# Patient Record
Sex: Male | Born: 1970 | Hispanic: No | Smoking: Never smoker
Health system: Southern US, Community
[De-identification: ages and names within clinical notes are randomized; demographics above are authoritative.]

## PROBLEM LIST (undated history)

## (undated) DIAGNOSIS — J45909 Unspecified asthma, uncomplicated: Secondary | ICD-10-CM

## (undated) DIAGNOSIS — T7840XA Allergy, unspecified, initial encounter: Secondary | ICD-10-CM

## (undated) HISTORY — DX: Allergy, unspecified, initial encounter: T78.40XA

## (undated) HISTORY — DX: Unspecified asthma, uncomplicated: J45.909

---

## 1998-11-28 ENCOUNTER — Emergency Department (HOSPITAL_COMMUNITY): Admission: EM | Admit: 1998-11-28 | Discharge: 1998-11-28 | Payer: Self-pay | Admitting: Emergency Medicine

## 2001-01-08 ENCOUNTER — Encounter: Admission: RE | Admit: 2001-01-08 | Discharge: 2001-01-08 | Payer: Self-pay | Admitting: Specialist

## 2001-01-08 ENCOUNTER — Encounter: Payer: Self-pay | Admitting: Specialist

## 2007-07-15 HISTORY — PX: CYST REMOVAL HAND: SHX6279

## 2008-04-04 ENCOUNTER — Ambulatory Visit (HOSPITAL_BASED_OUTPATIENT_CLINIC_OR_DEPARTMENT_OTHER): Admission: RE | Admit: 2008-04-04 | Discharge: 2008-04-04 | Payer: Self-pay | Admitting: Internal Medicine

## 2008-04-19 ENCOUNTER — Ambulatory Visit: Payer: Self-pay | Admitting: Internal Medicine

## 2010-11-26 NOTE — Procedures (Signed)
NAMEJALIEN, WEAKLAND                 ACCOUNT NO.:  [id number]   MEDICAL RECORD NO.:  [id number]          PATIENT TYPE:  OUT   LOCATION:  SLEEP CENTER                 FACILITY:  [facility]   PHYSICIAN:  Clinton D. Maple Hudson, MD, FCCP, FACPDATE OF BIRTH:  09/22/70   DATE OF STUDY:  04/04/2008                            NOCTURNAL POLYSOMNOGRAM   REFERRING PHYSICIAN:  Soyla Murphy. Renne Crigler, M.D.   REFERRING PHYSICIAN:  Soyla Murphy. Renne Crigler, M.D.   INDICATION FOR STUDY:  Insomnia with sleep apnea.  Repeated waking after  sleep onset.   EPWORTH SLEEPINESS SCORE:  18/24.   BMI:  25.8.   WEIGHT:  185 pounds.   HEIGHT:  71 inches.   NECK:  15 inches.   HOME MEDICATIONS:  Alfonso Patten.   SLEEP ARCHITECTURE:  This was done as a daytime study to accommodate his  sleep schedule with lights out at 8:22 a.m. and lights on at 1450 p.m.  total sleep time 346 minutes with sleep efficiency 89.3%.  Stage I was  5.3%.  Stage II 70.4%.  Stage III 2%.  REM 22.3% of total sleep time.  Sleep latency 6 minutes.  REM latency 67 minutes.  Awake after sleep  onset 35.5 minutes.  Arousal index 14.4.  The patient took Lunesta at  0800 hours.   RESPIRATORY DATA:  Apnea-hypopnea index (AHI) 0.5 per hour.  Respiratory  Disturbance Index (RDI) 2.8 per hour.  RERA count 13.  Index 2.3 per  hour.  The total of three events were scored, including two obstructive  apneas and one hypopnea.  Events were not significantly positional.  REM  AHI 0.  They were insufficient to permit CPAP titration by split  protocol on this study.   OXYGEN DATA:  Mild-to-moderate snoring with oxygen desaturation to a  nadir of 89%.  Mean oxygen saturation through the study was 97.2% on  room air.   CARDIAC DATA:  Normal sinus rhythm.   MOVEMENT-PARASOMNIA:  Occasional limb jerk, insignificant.  Movement-  related arousal index at 0.2 per hour.   IMPRESSIONS-RECOMMENDATIONS:  1. Daytime study to accommodate his third shift work schedule,  augmented with Lunesta.  2. Insignificant respiratory and movement-related sleep disturbance.      Apnea-hypopnea index (AHI) 0.5 per hour, Respiratory Disturbance      Index (RDI) 2.8 per hour, periodic limb movement, ProSeal laryngeal      mask airway (PLMA) 0.2 per hour.      Clinton D. Maple Hudson, MD, [facility], FACP  Diplomate, Biomedical engineer of Sleep Medicine  Electronically Signed     CDY/MEDQ  D:  04/19/2008 19:20:48  T:  04/19/2008 20:11:05  Job:  [id number]

## 2011-11-03 ENCOUNTER — Ambulatory Visit (INDEPENDENT_AMBULATORY_CARE_PROVIDER_SITE_OTHER): Payer: BC Managed Care – PPO | Admitting: Internal Medicine

## 2011-11-03 VITALS — BP 124/77 | HR 51 | Temp 98.5°F | Resp 18 | Wt 195.0 lb

## 2011-11-03 DIAGNOSIS — J301 Allergic rhinitis due to pollen: Secondary | ICD-10-CM

## 2011-11-03 DIAGNOSIS — J309 Allergic rhinitis, unspecified: Secondary | ICD-10-CM

## 2011-11-03 DIAGNOSIS — J9801 Acute bronchospasm: Secondary | ICD-10-CM

## 2011-11-03 MED ORDER — PREDNISONE 20 MG PO TABS: ORAL_TABLET | ORAL | Status: DC

## 2011-11-03 MED ORDER — MONTELUKAST SODIUM 10 MG PO TABS: 10.0000 mg | ORAL_TABLET | Freq: Every day | ORAL | Status: DC

## 2011-11-03 MED ORDER — ALBUTEROL SULFATE HFA 108 (90 BASE) MCG/ACT IN AERS: 2.0000 | INHALATION_SPRAY | Freq: Four times a day (QID) | RESPIRATORY_TRACT | Status: DC | PRN

## 2011-11-03 NOTE — Progress Notes (Signed)
  Subjective:    Patient ID: Brett Schneider, male    DOB: Dec 04, 1970, 41 y.o.   MRN: [id number]  HPIWheezing with itchy eyes and runny nose for the last 3 weeks Happens every spring Was followed by [place] [facility] for a while and would use prednisone once each spring for best control No history of year-round allergies or asthma   Review of Systems     Objective:   Physical Exam Conjunctivae injected Nares slightly boggy Throat clear No nodes Slight wheeze with forced expiration       Assessment & Plan:  Problem #1 allergic bronchospasm Problem #2 allergic rhinitis Problem #3 allergic conjunctivitis  Prednisone 60-0/12 days Singulair 10 mg at bedtime for 3 months Albuterol inhaler when necessary

## 2012-07-31 ENCOUNTER — Ambulatory Visit: Payer: BC Managed Care – PPO | Admitting: Family Medicine

## 2012-07-31 VITALS — BP 125/71 | HR 56 | Temp 98.0°F | Resp 16 | Ht 71.0 in | Wt 204.2 lb

## 2012-07-31 DIAGNOSIS — J40 Bronchitis, not specified as acute or chronic: Secondary | ICD-10-CM

## 2012-07-31 MED ORDER — METHYLPREDNISOLONE 4 MG PO KIT: PACK | ORAL | Status: DC

## 2012-07-31 MED ORDER — HYDROCODONE-HOMATROPINE 5-1.5 MG/5ML PO SYRP: 5.0000 mL | ORAL_SOLUTION | Freq: Three times a day (TID) | ORAL | Status: DC | PRN

## 2012-07-31 MED ORDER — AZITHROMYCIN 250 MG PO TABS: ORAL_TABLET | ORAL | Status: DC

## 2012-07-31 NOTE — Patient Instructions (Signed)

## 2012-07-31 NOTE — Progress Notes (Signed)
Patient ID: Brett Schneider MRN: [id number], DOB: Nov 17, 1970, 42 y.o. Date of Encounter: 07/31/2012, 10:00 AM  Primary Physician: No primary provider on file.  Chief Complaint:  Chief Complaint  Patient presents with  . Cough  . Fatigue    HPI: 42 y.o. year old male presents with a 30 day history of nasal congestion, post nasal drip, sore throat, and cough. Mild sinus pressure. Afebrile. No chills. Nasal congestion thick and green/yellow. Cough is productive of green/yellow sputum and not associated with time of day. Ears feel full, leading to sensation of muffled hearing. Has tried OTC cold preps without success. No GI complaints.   No sick contacts, recent antibiotics, or recent travels.   No leg trauma, sedentary periods, h/o cancer, or tobacco use.  He has stopped working out because of the fatigue. Works for [employer]   Past Medical History  Diagnosis Date  . Asthma   . Allergy      Home Meds: Prior to Admission medications   Medication Sig Start Date End Date Taking? Authorizing Provider  albuterol (PROVENTIL HFA;VENTOLIN HFA) 108 (90 BASE) MCG/ACT inhaler Inhale 2 puffs into the lungs every 6 (six) hours as needed for wheezing. 11/03/11 11/02/12 Yes Tonye Pearson, MD  fish oil-omega-3 fatty acids 1000 MG capsule Take 1 g by mouth daily.   Yes Historical Provider, MD  montelukast (SINGULAIR) 10 MG tablet Take 1 tablet (10 mg total) by mouth at bedtime. 11/03/11 11/02/12 Yes Tonye Pearson, MD  predniSONE (DELTASONE) 10 MG tablet Take 10 mg by mouth daily.    Historical Provider, MD  predniSONE (DELTASONE) 20 MG tablet 3/3/3/3/2/2/2/2/1/1/1/1 single daily dose for 12 days 11/03/11   Tonye Pearson, MD    Allergies: No Known Allergies  History   Social History  . Marital Status: Divorced    Spouse Name: N/A    Number of Children: N/A  . Years of Education: N/A   Occupational History  . Not on file.   Social History Main Topics  . Smoking status: Never Smoker     . Smokeless tobacco: Not on file  . Alcohol Use: Yes  . Drug Use: No  . Sexually Active: Yes    Birth Control/ Protection: None   Other Topics Concern  . Not on file   Social History Narrative  . No narrative on file     Review of Systems: Constitutional: negative for chills, fever, night sweats or weight changes Cardiovascular: negative for chest pain or palpitations Respiratory: negative for hemoptysis, wheezing, or shortness of breath Abdominal: negative for abdominal pain, nausea, vomiting or diarrhea Dermatological: negative for rash Neurologic: negative for headache   Physical Exam: Blood pressure 125/71, pulse 56, temperature 98 F (36.7 C), temperature source Oral, resp. rate 16, height 5\' 11"  (1.803 m), weight 204 lb 3.2 oz (92.625 kg)., Body mass index is 28.48 kg/(m^2). General: Well developed, well nourished, in no acute distress. Head: Normocephalic, atraumatic, eyes without discharge, sclera non-icteric, nares are congested. Bilateral auditory canals clear, TM's are without perforation, pearly grey with reflective cone of light bilaterally. No sinus TTP. Oral cavity moist, dentition normal. Posterior pharynx with post nasal drip and mild erythema. No peritonsillar abscess or tonsillar exudate. Neck: Supple. No thyromegaly. Full ROM. No lymphadenopathy. Lungs: Coarse breath sounds bilaterally without  rales, or rhonchi. Breathing is unlabored.   Expiratory wheezes bilaterally Heart: RRR with S1 S2. No murmurs, rubs, or gallops appreciated. Msk:  Strength and tone normal for age. Extremities: No clubbing  or cyanosis. No edema. Neuro: Alert and oriented X 3. Moves all extremities spontaneously. CNII-XII grossly in tact. Psych:  Responds to questions appropriately with a normal affect.     ASSESSMENT AND PLAN:  42 y.o. year old male with bronchitis. 1. Bronchitis  methylPREDNISolone (MEDROL, PAK,) 4 MG tablet, HYDROcodone-homatropine (HYCODAN) 5-1.5 MG/5ML syrup,  azithromycin (ZITHROMAX Z-PAK) 250 MG tablet    - -Mucinex -Tylenol/Motrin prn -Rest/fluids -RTC precautions -RTC 3-5 days if no improvement  Signed, Elvina Sidle, MD 07/31/2012 10:00 AM

## 2012-10-28 ENCOUNTER — Ambulatory Visit: Payer: BC Managed Care – PPO

## 2012-10-28 ENCOUNTER — Ambulatory Visit (INDEPENDENT_AMBULATORY_CARE_PROVIDER_SITE_OTHER): Payer: BC Managed Care – PPO | Admitting: Family Medicine

## 2012-10-28 VITALS — BP 122/78 | HR 62 | Temp 98.0°F | Resp 16 | Ht 71.4 in | Wt 202.2 lb

## 2012-10-28 DIAGNOSIS — R05 Cough: Secondary | ICD-10-CM

## 2012-10-28 DIAGNOSIS — J40 Bronchitis, not specified as acute or chronic: Secondary | ICD-10-CM

## 2012-10-28 DIAGNOSIS — J309 Allergic rhinitis, unspecified: Secondary | ICD-10-CM

## 2012-10-28 DIAGNOSIS — J45901 Unspecified asthma with (acute) exacerbation: Secondary | ICD-10-CM

## 2012-10-28 DIAGNOSIS — J069 Acute upper respiratory infection, unspecified: Secondary | ICD-10-CM

## 2012-10-28 LAB — POCT INFLUENZA A/B
Influenza A, POC: NEGATIVE
Influenza B, POC: NEGATIVE

## 2012-10-28 MED ORDER — ALBUTEROL SULFATE HFA 108 (90 BASE) MCG/ACT IN AERS: 2.0000 | INHALATION_SPRAY | Freq: Four times a day (QID) | RESPIRATORY_TRACT | Status: DC | PRN

## 2012-10-28 MED ORDER — MONTELUKAST SODIUM 10 MG PO TABS: 10.0000 mg | ORAL_TABLET | Freq: Every day | ORAL | Status: DC

## 2012-10-28 MED ORDER — ALBUTEROL SULFATE (2.5 MG/3ML) 0.083% IN NEBU
2.5000 mg | INHALATION_SOLUTION | Freq: Once | RESPIRATORY_TRACT | Status: AC
  Administered 2012-10-28: 2.5 mg via RESPIRATORY_TRACT

## 2012-10-28 MED ORDER — PREDNISONE 20 MG PO TABS: ORAL_TABLET | ORAL | Status: DC

## 2012-10-28 MED ORDER — IPRATROPIUM BROMIDE 0.03 % NA SOLN: 2.0000 | Freq: Two times a day (BID) | NASAL | Status: DC

## 2012-10-28 MED ORDER — IPRATROPIUM BROMIDE 0.02 % IN SOLN
0.5000 mg | Freq: Once | RESPIRATORY_TRACT | Status: AC
  Administered 2012-10-28: 0.5 mg via RESPIRATORY_TRACT

## 2012-10-28 MED ORDER — AZITHROMYCIN 250 MG PO TABS: ORAL_TABLET | ORAL | Status: DC

## 2012-10-28 NOTE — Progress Notes (Signed)
[address]   [place], [place]  [address]   [phone]  Subjective:    Patient ID: Brett Schneider, male    DOB: 11-12-1970, 42 y.o.   MRN: [id number]  HPI This 42 y.o. male presents for evaluation of shortness of breath, wheezing, fatigue.  +feverish; +sweats/chills.  +HA  Mild ear pain; no sore throat.  +rhinorrhea; +nasal congestion; +coughing dry.  +SOB.  +wheezing.  Using Albuterol bid with some improvement.  Nighttime awakening due to wheezing.  Scant wheezing.  +itchy eyes and nose.  No n/v/d.  No flu vaccine this season.  +body aches.   No tobacco.  [employer] package handler.  Started Claritin two weeks ago.  Also purchased OTC Nasacort AQ without improvement; chronic use of Afrin. Previous use of Astelin without much improvement.   Review of Systems  Constitutional: Positive for fever, chills, diaphoresis and fatigue.  HENT: Positive for ear pain, congestion, rhinorrhea and postnasal drip. Negative for sore throat, trouble swallowing, voice change and sinus pressure.   Respiratory: Positive for cough, shortness of breath and wheezing. Negative for stridor.   Gastrointestinal: Negative for nausea, vomiting and diarrhea.  Skin: Negative for rash.        Past Medical History  Diagnosis Date  . Asthma   . Allergy     History reviewed. No pertinent past surgical history.  Prior to Admission medications   Medication Sig Start Date End Date Taking? Authorizing Provider  albuterol (PROVENTIL HFA;VENTOLIN HFA) 108 (90 BASE) MCG/ACT inhaler Inhale 2 puffs into the lungs every 6 (six) hours as needed for wheezing. 10/28/12 10/28/13 Yes Ethelda Chick, MD  azithromycin (ZITHROMAX Z-PAK) 250 MG tablet Take as directed on pack 10/28/12   Ethelda Chick, MD  fish oil-omega-3 fatty acids 1000 MG capsule Take 1 g by mouth daily.    Historical Provider, MD  HYDROcodone-homatropine (HYCODAN) 5-1.5 MG/5ML syrup Take 5 mLs by mouth every 8 (eight) hours as needed for cough. 07/31/12   Elvina Sidle, MD    ipratropium (ATROVENT) 0.03 % nasal spray Place 2 sprays into the nose 2 (two) times daily. 10/28/12   Ethelda Chick, MD  methylPREDNISolone (MEDROL, PAK,) 4 MG tablet follow package directions 07/31/12   Elvina Sidle, MD  montelukast (SINGULAIR) 10 MG tablet Take 1 tablet (10 mg total) by mouth at bedtime. 10/28/12 10/28/13  Ethelda Chick, MD  predniSONE (DELTASONE) 20 MG tablet Three tablets daily x 2 days then two tablets daily x 5 days then one tablet daily x 5 days 10/28/12   Ethelda Chick, MD    No Known Allergies  History   Social History  . Marital Status: Divorced    Spouse Name: N/A    Number of Children: N/A  . Years of Education: N/A   Occupational History  .  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 Smoking status: Never Smoker   . Smokeless tobacco: Not on file  . Alcohol Use: Yes  . Drug Use: No  . Sexually Active: Yes    Birth Control/ Protection: None   Other Topics Concern  . Not on file   Social History Narrative   Employment:  Electrical engineer.    History reviewed. No pertinent family history.  Objective:   Physical Exam  Nursing note and vitals reviewed. Constitutional: He is oriented to person, place, and time. He appears well-developed and well-nourished. No distress.  HENT:  Head: Normocephalic and atraumatic.  Right Ear: External ear normal.  Left Ear: External ear normal.  Mouth/Throat: Oropharynx is clear and moist.  Eyes: Conjunctivae and EOM are normal. Pupils are equal, round, and reactive to light.  Neck: Normal range of motion. Neck supple.  Cardiovascular: Normal rate, regular rhythm and normal heart sounds.  Exam reveals no gallop and no friction rub.   No murmur heard. Pulmonary/Chest: Effort normal and breath sounds normal. He has no wheezes. He has no rales.  Lymphadenopathy:    He has no cervical adenopathy.  Neurological: He is alert and oriented to person, place, and time.  Skin: He is not diaphoretic.   Psychiatric: He has a normal mood and affect. His behavior is normal.       PEAK FLOW 550, 575  (PREDICTED 596).  [facility] reading (PRIMARY) by  Dr. Katrinka Blazing.  CXR: NAD.  ALBUTEROL/ATROVENT NEBULIZER ADMINISTERED IN OFFICE.  Results for orders placed in visit on 10/28/12  POCT INFLUENZA A/B      Result Value Range   Influenza A, POC Negative     Influenza B, POC Negative      Assessment & Plan:  Cough - Plan: POCT Influenza A/B, DG Chest 2 View, albuterol (PROVENTIL) (2.5 MG/3ML) 0.083% nebulizer solution 2.5 mg, ipratropium (ATROVENT) nebulizer solution 0.5 mg  Acute upper respiratory infections of unspecified site  Asthma with acute exacerbation   1.  Asthma Exacerbation:  New.  S/p Albuterol/Atrovent nebulizer in office. Rx for Prednisone taper, Singulair, Albuterol.  RTC for acute worsening. 2.  URI:  New.  CXR negative; rx for Zpack provided. 3. Allergic Rhinitis:  Worsening; continue Claritin; rx for Atrovent nasal spray, Singulair provided.  Continue Nasacort AQ.  Meds ordered this encounter  Medications  . albuterol (PROVENTIL) (2.5 MG/3ML) 0.083% nebulizer solution 2.5 mg    Sig:   . ipratropium (ATROVENT) nebulizer solution 0.5 mg    Sig:   . predniSONE (DELTASONE) 20 MG tablet    Sig: Three tablets daily x 2 days then two tablets daily x 5 days then one tablet daily x 5 days    Dispense:  21 tablet    Refill:  0  . montelukast (SINGULAIR) 10 MG tablet    Sig: Take 1 tablet (10 mg total) by mouth at bedtime.    Dispense:  30 tablet    Refill:  11  . azithromycin (ZITHROMAX Z-PAK) 250 MG tablet    Sig: Take as directed on pack    Dispense:  6 tablet    Refill:  0  . albuterol (PROVENTIL HFA;VENTOLIN HFA) 108 (90 BASE) MCG/ACT inhaler    Sig: Inhale 2 puffs into the lungs every 6 (six) hours as needed for wheezing.    Dispense:  1 Inhaler    Refill:  3  . ipratropium (ATROVENT) 0.03 % nasal spray    Sig: Place 2 sprays into the nose 2 (two) times daily.     Dispense:  30 mL    Refill:  5

## 2013-10-24 ENCOUNTER — Ambulatory Visit (INDEPENDENT_AMBULATORY_CARE_PROVIDER_SITE_OTHER): Payer: BC Managed Care – PPO | Admitting: Family Medicine

## 2013-10-24 VITALS — BP 112/70 | HR 65 | Temp 97.9°F | Ht 71.4 in | Wt 201.0 lb

## 2013-10-24 DIAGNOSIS — J309 Allergic rhinitis, unspecified: Secondary | ICD-10-CM

## 2013-10-24 DIAGNOSIS — J302 Other seasonal allergic rhinitis: Secondary | ICD-10-CM

## 2013-10-24 MED ORDER — PREDNISONE 20 MG PO TABS: ORAL_TABLET | ORAL | Status: DC

## 2013-10-24 NOTE — Progress Notes (Signed)
This chart was scribed for Elvina SidleKurt Lauenstein, MD by Quintella ReichertMatthew Underwood, Scribe.  [facility] and the patient's care was started at 7:56 PM.  @[facility] @  Patient ID: Brett Schneider MRN: [id number], DOB: [birth year], 43 y.o. Date of Encounter: 10/24/2013, 7:55 PM  Primary Physician: No primary provider on file.  Chief Complaint: Allergies  HPI: 43 y.o. year old male with history below presents with allergy symptoms that began several days ago.  Pt characterizes symptoms as typical for his seasonal allergies including eye irritation, cough, ear pressure, wheezing, and fatigue.  He notes that he usually uses a mask when he does yard work but he recently did it without a mask.  He has been taking Mucinex, Nyquil, and Afrin without relief.  He states he typically receives a Dose Pack here which relieves his symptoms.  He has an inhaler at home but has not begun using it yet.  He works at [employer] and is on vacation.   Past Medical History  Diagnosis Date   Asthma    Allergy      Home Meds: Prior to Admission medications   Medication Sig Start Date End Date Taking? Authorizing Provider  albuterol (PROVENTIL HFA;VENTOLIN HFA) 108 (90 BASE) MCG/ACT inhaler Inhale 2 puffs into the lungs every 6 (six) hours as needed for wheezing. 10/28/12 10/28/13 Yes Ethelda ChickKristi M Smith, MD  fish oil-omega-3 fatty acids 1000 MG capsule Take 1 g by mouth daily.   Yes Historical Provider, MD  ipratropium (ATROVENT) 0.03 % nasal spray Place 2 sprays into the nose 2 (two) times daily. 10/28/12  Yes Ethelda ChickKristi M Smith, MD  montelukast (SINGULAIR) 10 MG tablet Take 1 tablet (10 mg total) by mouth at bedtime. 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     Review of Systems: Constitutional: positive for fatigue.  negative for chills, fever, night sweats, or weight changes. HEENT: negative for vision changes, hearing loss, or epistaxis Cardiovascular: negative for chest pain or palpitations Respiratory: negative for hemoptysis Abdominal: negative for abdominal pain, nausea, vomiting, diarrhea, or constipation Dermatological: negative for rash Neurologic: negative for headache, dizziness, or syncope All other systems reviewed and are otherwise negative with the exception to those above and in the HPI.   Physical Exam: Blood pressure 112/70, pulse 65, temperature 97.9 F (36.6 C), temperature source Oral, height 5' 11.4" (1.814 m), weight 201 lb (91.173 kg), SpO2 98.00%., Body mass index is 27.71 kg/(m^2). General: Well developed, well nourished, in no acute distress. Head: Normocephalic, atraumatic, eyes without discharge, sclera non-icteric.  Nasal passages swollen.. Bilateral auditory canals clear, TM's are without perforation, pearly grey and translucent with reflective cone of light bilaterally. Oral cavity moist, posterior pharynx without exudate, erythema, peritonsillar abscess, or post nasal drip.  Neck: Supple. No thyromegaly. Full ROM. No lymphadenopathy. Lungs: Faint wheezes.  Otherwise clear bilaterally to auscultation without rales or rhonchi. Breathing is unlabored. Heart: RRR with S1 S2. No murmurs, rubs, or gallops appreciated. Abdomen: Soft, non-tender, non-distended with normoactive bowel sounds. No hepatomegaly. No rebound/guarding. No obvious abdominal masses. Msk:  Strength and tone normal for age. Extremities/Skin: Warm and dry. No clubbing or cyanosis. No edema.  No rashes or suspicious lesions. Neuro: Alert and oriented X 3. Moves all extremities spontaneously. Gait is normal. CNII-XII grossly in tact. Psych:  Responds to questions appropriately with a normal affect.     ASSESSMENT AND PLAN:  43 y.o. year old male with Seasonal allergies - Plan: predniSONE (DELTASONE) 20 MG tablet     Signed, Elvina SidleKurt Lauenstein, MD 10/24/2013 7:55 PM

## 2013-10-24 NOTE — Patient Instructions (Signed)

## 2013-12-23 ENCOUNTER — Ambulatory Visit (INDEPENDENT_AMBULATORY_CARE_PROVIDER_SITE_OTHER): Payer: BC Managed Care – PPO | Admitting: Physician Assistant

## 2013-12-23 VITALS — BP 110/70 | HR 72 | Temp 98.0°F | Resp 16 | Ht 70.5 in | Wt 199.8 lb

## 2013-12-23 DIAGNOSIS — R05 Cough: Secondary | ICD-10-CM

## 2013-12-23 DIAGNOSIS — J40 Bronchitis, not specified as acute or chronic: Secondary | ICD-10-CM

## 2013-12-23 DIAGNOSIS — R059 Cough, unspecified: Secondary | ICD-10-CM

## 2013-12-23 MED ORDER — AZITHROMYCIN 250 MG PO TABS: ORAL_TABLET | ORAL | Status: DC

## 2013-12-23 MED ORDER — HYDROCOD POLST-CHLORPHEN POLST 10-8 MG/5ML PO LQCR: 5.0000 mL | Freq: Two times a day (BID) | ORAL | Status: DC | PRN

## 2013-12-23 NOTE — Patient Instructions (Signed)

## 2013-12-23 NOTE — Progress Notes (Signed)
Subjective:    Patient ID: Brett Schneider, male    DOB: 03-02-1971, 43 y.o.   MRN: [id number]  HPI Primary Physician: No PCP Per Patient  Chief Complaint: URI x 6 days   HPI: 43 y.o. male with history below presents with 6 day history of nasal congestion, rhinorrhea, post nasal drip, sore throat, cough, headache, and fatigue. Afebrile. No chills. Cough is productive of green sputum and worse at nighttime, keeping him awake. No SOB or wheezing. Headache is located along the sinuses. Taking Mucinex and OTC cough medication without much success. Sick contacts at home with similar cough.    Past Medical History  Diagnosis Date  . Asthma   . Allergy      Home Meds: Prior to Admission medications   Medication Sig Start Date End Date Taking? Authorizing Provider  albuterol (PROVENTIL HFA;VENTOLIN HFA) 108 (90 BASE) MCG/ACT inhaler Inhale 2 puffs into the lungs every 6 (six) hours as needed for wheezing. 10/28/12 10/28/13 No Ethelda ChickKristi M Smith, MD  montelukast (SINGULAIR) 10 MG tablet Take 1 tablet (10 mg total) by mouth at bedtime. 10/28/12 10/28/13 No Ethelda ChickKristi M Smith, MD    Allergies: No Known Allergies  History   Social History  . Marital Status: Divorced    Spouse Name: N/A    Number of Children: N/A  . Years of Education: N/A   Occupational History  .  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 Smoking status: Never Smoker   . Smokeless tobacco: Not on file  . Alcohol Use: Yes  . Drug Use: No  . Sexual Activity: Yes    Birth Control/ Protection: None   Other Topics Concern  . 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     Review of Systems  Constitutional: Positive for fatigue. Negative for fever, chills and appetite change.  HENT: Positive for congestion, postnasal drip, rhinorrhea, sinus pressure and sore throat. Negative for hearing loss.        Hears cracking in the ears.   Respiratory: Positive for cough. Negative for  shortness of breath and wheezing.        Cough is worse when he lays down. Keeping him awake. Productive of green sputum.    Gastrointestinal: Negative for nausea, vomiting and diarrhea.  Neurological: Positive for headaches.       Sinus headache.        Objective:   Physical Exam  Physical Exam: Blood pressure 110/70, pulse 72, temperature 98 F (36.7 C), temperature source Oral, resp. rate 16, height 5' 10.5" (1.791 m), weight 199 lb 12.8 oz (90.629 kg), SpO2 98.00%., Body mass index is 28.25 kg/(m^2). General: Well developed, well nourished, in no acute distress. Head: Normocephalic, atraumatic, eyes without discharge, sclera non-icteric, nares are are congested. Bilateral auditory canals clear, TM's are without perforation, pearly grey and translucent with reflective cone of light bilaterally. Oral cavity moist, posterior pharynx with post nasal drip. No exudate, erythema, or peritonsillar abscess. Uvula midline.   Neck: Supple. No thyromegaly. Full ROM. No lymphadenopathy. No nuchal rigidity.  Lungs: Clear bilaterally to auscultation without wheezes, rales, or rhonchi. Breathing is unlabored. Heart: RRR with S1 S2. No murmurs, rubs, or gallops appreciated. Msk:  Strength and tone normal for age. Extremities/Skin: Warm and dry. No clubbing or cyanosis. No edema. No rashes or suspicious lesions. Neuro: Alert and oriented X 3. Moves all extremities spontaneously. Gait is normal. CNII-XII grossly  in tact. Psych:  Responds to questions appropriately with a normal affect.        Assessment & Plan:  43 year old male with bronchitis and cough -Azithromycin 250 MG #6 2 po first day then 1 po next 4 days no RF -Tussionex 1 tsp po q 12 hours prn cough #90 mL no RF, SED -Mucinex -Rest/fluids -RTC precautions   Eula Listenyan Izamar Linden, MHS, PA-C [facility] [address] [place], [place] [address] [phone] [facility] 12/23/2013 9:11 AM

## 2014-10-11 ENCOUNTER — Ambulatory Visit (INDEPENDENT_AMBULATORY_CARE_PROVIDER_SITE_OTHER): Payer: BLUE CROSS/BLUE SHIELD | Admitting: Physician Assistant

## 2014-10-11 VITALS — BP 108/72 | HR 65 | Temp 98.1°F | Resp 18 | Ht 71.0 in | Wt 208.0 lb

## 2014-10-11 DIAGNOSIS — J302 Other seasonal allergic rhinitis: Secondary | ICD-10-CM

## 2014-10-11 DIAGNOSIS — R05 Cough: Secondary | ICD-10-CM | POA: Diagnosis not present

## 2014-10-11 DIAGNOSIS — J4541 Moderate persistent asthma with (acute) exacerbation: Secondary | ICD-10-CM

## 2014-10-11 DIAGNOSIS — R0981 Nasal congestion: Secondary | ICD-10-CM | POA: Diagnosis not present

## 2014-10-11 DIAGNOSIS — R059 Cough, unspecified: Secondary | ICD-10-CM

## 2014-10-11 DIAGNOSIS — J45901 Unspecified asthma with (acute) exacerbation: Secondary | ICD-10-CM

## 2014-10-11 MED ORDER — CETIRIZINE HCL 10 MG PO TABS: 10.0000 mg | ORAL_TABLET | Freq: Every day | ORAL | Status: DC

## 2014-10-11 MED ORDER — HYDROCOD POLST-CHLORPHEN POLST 10-8 MG/5ML PO LQCR: 5.0000 mL | Freq: Every evening | ORAL | Status: AC | PRN

## 2014-10-11 MED ORDER — ALBUTEROL SULFATE HFA 108 (90 BASE) MCG/ACT IN AERS: 2.0000 | INHALATION_SPRAY | RESPIRATORY_TRACT | Status: DC | PRN

## 2014-10-11 MED ORDER — BECLOMETHASONE DIPROPIONATE 40 MCG/ACT IN AERS: 2.0000 | INHALATION_SPRAY | Freq: Two times a day (BID) | RESPIRATORY_TRACT | Status: DC

## 2014-10-11 MED ORDER — IPRATROPIUM BROMIDE 0.03 % NA SOLN: 2.0000 | Freq: Two times a day (BID) | NASAL | Status: DC

## 2014-10-11 MED ORDER — PREDNISONE 20 MG PO TABS: ORAL_TABLET | ORAL | Status: AC

## 2014-10-11 NOTE — Patient Instructions (Addendum)
Please use the albuterol every 6 hours as needed.  If you find that you still need this after 48 hrs, fill the prednisone. At this time, start taking administering the qvar once twice per day.  Do this every day, and thereafter during this season.  This should help not having to use the albuterol.  Come next year, you should start using the qvar early, when pollen season nears. Take the cetirizine once per day for allergies.    Asthma Asthma is a recurring condition in which the airways tighten and narrow. Asthma can make it difficult to breathe. It can cause coughing, wheezing, and shortness of breath. Asthma episodes, also called asthma attacks, range from minor to life-threatening. Asthma cannot be cured, but medicines and lifestyle changes can help control it. CAUSES Asthma is believed to be caused by inherited (genetic) and environmental factors, but its exact cause is unknown. Asthma may be triggered by allergens, lung infections, or irritants in the air. Asthma triggers are different for each person. Common triggers include:   Animal dander.  Dust mites.  Cockroaches.  Pollen from trees or grass.  Mold.  Smoke.  Air pollutants such as dust, household cleaners, hair sprays, aerosol sprays, paint fumes, strong chemicals, or strong odors.  Cold air, weather changes, and winds (which increase molds and pollens in the air).  Strong emotional expressions such as crying or laughing hard.  Stress.  Certain medicines (such as aspirin) or types of drugs (such as beta-blockers).  Sulfites in foods and drinks. Foods and drinks that may contain sulfites include dried fruit, potato chips, and sparkling grape juice.  Infections or inflammatory conditions such as the flu, a cold, or an inflammation of the nasal membranes (rhinitis).  Gastroesophageal reflux disease (GERD).  Exercise or strenuous activity. SYMPTOMS Symptoms may occur immediately after asthma is triggered or many hours  later. Symptoms include:  Wheezing.  Excessive nighttime or early morning coughing.  Frequent or severe coughing with a common cold.  Chest tightness.  Shortness of breath. DIAGNOSIS  The diagnosis of asthma is made by a review of your medical history and a physical exam. Tests may also be performed. These may include:  Lung function studies. These tests show how much air you breathe in and out.  Allergy tests.  Imaging tests such as X-rays. TREATMENT  Asthma cannot be cured, but it can usually be controlled. Treatment involves identifying and avoiding your asthma triggers. It also involves medicines. There are 2 classes of medicine used for asthma treatment:   Controller medicines. These prevent asthma symptoms from occurring. They are usually taken every day.  Reliever or rescue medicines. These quickly relieve asthma symptoms. They are used as needed and provide short-term relief. Your health care provider will help you create an asthma action plan. An asthma action plan is a written plan for managing and treating your asthma attacks. It includes a list of your asthma triggers and how they may be avoided. It also includes information on when medicines should be taken and when their dosage should be changed. An action plan may also involve the use of a device called a peak flow meter. A peak flow meter measures how well the lungs are working. It helps you monitor your condition. HOME CARE INSTRUCTIONS   Take medicines only as directed by your health care provider. Speak with your health care provider if you have questions about how or when to take the medicines.  Use a peak flow meter as directed by  your health care provider. Record and keep track of readings.  Understand and use the action plan to help minimize or stop an asthma attack without needing to seek medical care.  Control your home environment in the following ways to help prevent asthma attacks:  Do not smoke. Avoid  being exposed to secondhand smoke.  Change your heating and air conditioning filter regularly.  Limit your use of fireplaces and wood stoves.  Get rid of pests (such as roaches and mice) and their droppings.  Throw away plants if you see mold on them.  Clean your floors and dust regularly. Use unscented cleaning products.  Try to have someone else vacuum for you regularly. Stay out of rooms while they are being vacuumed and for a short while afterward. If you vacuum, use a dust mask from a hardware store, a double-layered or microfilter vacuum cleaner bag, or a vacuum cleaner with a HEPA filter.  Replace carpet with wood, tile, or vinyl flooring. Carpet can trap dander and dust.  Use allergy-proof pillows, mattress covers, and box spring covers.  Wash bed sheets and blankets every week in hot water and dry them in a dryer.  Use blankets that are made of polyester or cotton.  Clean bathrooms and kitchens with bleach. If possible, have someone repaint the walls in these rooms with mold-resistant paint. Keep out of the rooms that are being cleaned and painted.  Wash hands frequently. SEEK MEDICAL CARE IF:   You have wheezing, shortness of breath, or a cough even if taking medicine to prevent attacks.  The colored mucus you cough up (sputum) is thicker than usual.  Your sputum changes from clear or white to yellow, green, gray, or bloody.  You have any problems that may be related to the medicines you are taking (such as a rash, itching, swelling, or trouble breathing).  You are using a reliever medicine more than 2-3 times per week.  Your peak flow is still at 50-79% of your personal best after following your action plan for 1 hour.  You have a fever. SEEK IMMEDIATE MEDICAL CARE IF:   You seem to be getting worse and are unresponsive to treatment during an asthma attack.  You are short of breath even at rest.  You get short of breath when doing very little physical  activity.  You have difficulty eating, drinking, or talking due to asthma symptoms.  You develop chest pain.  You develop a fast heartbeat.  You have a bluish color to your lips or fingernails.  You are light-headed, dizzy, or faint.  Your peak flow is less than 50% of your personal best. MAKE SURE YOU:   Understand these instructions.  Will watch your condition.  Will get help right away if you are not doing well or get worse. Document Released: 06/30/2005 Document Revised: 11/14/2013 Document Reviewed: 01/27/2013 [facility] Patient Information 2015 [employer] [place]. This information is not intended to replace advice given to you by your health care provider. Make sure you discuss any questions you have with your health care provider.

## 2014-10-11 NOTE — Progress Notes (Signed)
[facility] [address], [place] [address] [phone]  Date:  10/11/2014   Name:  Brett Schneider   DOB:  05-14-1971   MRN:  [id number]  PCP:  No PCP Per Patient    Chief Complaint: Allergies   History of Present Illness:  Brett Schneider is a 44 y.o. male patient who presents with the following:  Patient reports trouble wheezing for the last few days, that worsened last night.  He states that he was having difficulty sleeping laying down, with a non-productive cough.  He is not using an inhaler, as it expired.  He has tried honey and tumeric without much relief.  He reports every year around this time, his allergies start coupled with asthma.  He uses nothing for his allergies.  He attempted claritin without much relief.  He reports now having no fever or chills.  He does have nasal congestion and some bilateral ear fullness.  He denies dizziness or chest pains.  He states that he does not have any hx of reflux or heartburn.  He uses afrin for his nasal congestion at night.  He denies using it every day. Patient reports that during the month of April and may, he has to use albuterol inhaler 2-3 times per day.  There are some days where he is able to manage without.  During other months of the year, he has no trouble with breathing.  Again, he does not take anything for his allergies regularly stating, "I dont like to take medications".  He states the only thing that helps him is prednisone.      There are no active problems to display for this patient.   Past Medical History  Diagnosis Date  . Asthma   . Allergy     History reviewed. No pertinent past surgical history.  History  Substance Use Topics  . Smoking status: Never Smoker   . Smokeless tobacco: Not on file  . Alcohol Use: Yes    History reviewed. No pertinent family history.  No Known Allergies  Medication list has been reviewed and updated.  Current Outpatient Prescriptions on File Prior to  Visit  Medication Sig Dispense Refill  . albuterol (PROVENTIL HFA;VENTOLIN HFA) 108 (90 BASE) MCG/ACT inhaler Inhale 2 puffs into the lungs every 6 (six) hours as needed for wheezing. (Patient not taking: Reported on 10/11/2014) 1 Inhaler 3  . azithromycin (ZITHROMAX Z-PAK) 250 MG tablet 2 tabs po first day, then 1 tab po next 4 days (Patient not taking: Reported on 10/11/2014) 6 tablet 0  . chlorpheniramine-HYDROcodone (TUSSIONEX PENNKINETIC ER) 10-8 MG/5ML LQCR Take 5 mLs by mouth every 12 (twelve) hours as needed for cough. (Patient not taking: Reported on 10/11/2014) 90 mL 0  . montelukast (SINGULAIR) 10 MG tablet Take 1 tablet (10 mg total) by mouth at bedtime. (Patient not taking: Reported on 10/11/2014) 30 tablet 11   No current facility-administered medications on file prior to visit.    Review of Systems: ROS otherwise unremarkable at this time.  Physical Examination: Filed Vitals:   10/11/14 0900  BP: 108/72  Pulse: 65  Temp: 98.1 F (36.7 C)  Resp: 18   Filed Vitals:   10/11/14 0900  Height:  (1.803 m)  Weight: 208 lb (94.348 kg)   Body mass index is 29.02 kg/(m^2). Ideal Body Weight: Weight in (lb) to have BMI = 25: 178.9  Physical Exam  Constitutional: He is oriented to person, place, and time. He appears  well-developed and well-nourished. No distress.  HENT:  Right Ear: External ear normal.  Left Ear: External ear normal.  Eyes: Conjunctivae and EOM are normal. Pupils are equal, round, and reactive to light. Right eye exhibits no discharge. Left eye exhibits no discharge.  Cardiovascular: Normal rate, regular rhythm and normal heart sounds.   Pulmonary/Chest: Effort normal and breath sounds normal. No respiratory distress. He has no wheezes.  Lymphadenopathy:    He has no cervical adenopathy.  Neurological: He is alert and oriented to person, place, and time.  Skin: Skin is warm and dry.  Psychiatric: He has a normal mood and affect. His behavior is normal.      Assessment and Plan: 44 year old male is here today for chief complaint of wheezing that worsened last night. -Advised patient of chronic use of afrin -Patient lung sounds and peak flow are within normal range.  However given hx., patient could use a preventative measures for allergies to not exacerbate the asthma, as well as preventative maintenance to decrease albuterol use.     Asthma exacerbation, mild - Plan: beclomethasone (QVAR) 40 MCG/ACT inhaler, predniSONE (DELTASONE) 20 MG tablet, albuterol (PROVENTIL HFA;VENTOLIN HFA) 108 (90 BASE) MCG/ACT inhaler  Seasonal allergies - Plan: cetirizine (ZYRTEC) 10 MG tablet  Cough - Plan: chlorpheniramine-HYDROcodone (TUSSIONEX PENNKINETIC ER) 10-8 MG/5ML LQCR  Nasal congestion - Plan: ipratropium (ATROVENT) 0.03 % nasal spray  Asthma with acute exacerbation, moderate persistent  Trena PlattStephanie Zyair Russi, PA-C [facility] 3/30/201611:00 AM    .

## 2014-12-11 ENCOUNTER — Other Ambulatory Visit: Payer: Self-pay

## 2014-12-11 DIAGNOSIS — J45901 Unspecified asthma with (acute) exacerbation: Secondary | ICD-10-CM

## 2014-12-11 MED ORDER — BECLOMETHASONE DIPROPIONATE 40 MCG/ACT IN AERS: 2.0000 | INHALATION_SPRAY | Freq: Two times a day (BID) | RESPIRATORY_TRACT | Status: DC

## 2015-10-22 ENCOUNTER — Other Ambulatory Visit: Payer: Self-pay | Admitting: Physician Assistant

## 2015-10-24 ENCOUNTER — Ambulatory Visit (INDEPENDENT_AMBULATORY_CARE_PROVIDER_SITE_OTHER): Payer: BLUE CROSS/BLUE SHIELD | Admitting: Physician Assistant

## 2015-10-24 VITALS — BP 119/79 | HR 64 | Temp 97.6°F | Resp 18 | Ht 71.0 in | Wt 206.5 lb

## 2015-10-24 DIAGNOSIS — J302 Other seasonal allergic rhinitis: Secondary | ICD-10-CM | POA: Insufficient documentation

## 2015-10-24 DIAGNOSIS — J4540 Moderate persistent asthma, uncomplicated: Secondary | ICD-10-CM | POA: Diagnosis not present

## 2015-10-24 MED ORDER — MONTELUKAST SODIUM 10 MG PO TABS: 10.0000 mg | ORAL_TABLET | Freq: Every day | ORAL | Status: DC

## 2015-10-24 MED ORDER — CETIRIZINE HCL 10 MG PO TABS: 10.0000 mg | ORAL_TABLET | Freq: Every day | ORAL | Status: DC

## 2015-10-24 MED ORDER — FLUTICASONE PROPIONATE 50 MCG/ACT NA SUSP: 2.0000 | Freq: Every day | NASAL | Status: AC

## 2015-10-24 MED ORDER — BECLOMETHASONE DIPROPIONATE 40 MCG/ACT IN AERS: 2.0000 | INHALATION_SPRAY | Freq: Two times a day (BID) | RESPIRATORY_TRACT | Status: DC

## 2015-10-24 MED ORDER — ALBUTEROL SULFATE HFA 108 (90 BASE) MCG/ACT IN AERS: 2.0000 | INHALATION_SPRAY | RESPIRATORY_TRACT | Status: AC | PRN

## 2015-10-24 NOTE — Progress Notes (Signed)
Patient ID: Brett Schneider, male    DOB: 1971-06-09, 45 y.o.   MRN: [id number]  PCP: No PCP Per Patient  Subjective:   Chief Complaint  Patient presents with  . Allergies    x3-4 days    HPI Presents for evaluation of allergies and medication refill.  Has had significant allergies since 2000. He is out of his meds and so has had worsening symptoms x 3-4 days. Reports that when he takes them regularly he is "fine," though notes that he till has congestion. He does not use montelukast anymore and isn't sure that it was helpful. He doesn't want to pursue immunotherapy at this time.    Review of Systems  Constitutional: Negative.   HENT: Positive for congestion, postnasal drip, rhinorrhea, sinus pressure, sneezing and sore throat. Negative for dental problem, drooling, ear discharge, ear pain, facial swelling, hearing loss, mouth sores, nosebleeds, tinnitus, trouble swallowing and voice change.   Eyes: Negative.   Respiratory: Negative.   Cardiovascular: Negative.   Gastrointestinal: Negative for nausea, vomiting and diarrhea.  Musculoskeletal: Negative for myalgias and arthralgias.  Skin: Negative for rash.       Patient Active Problem List   Diagnosis Date Noted  . Seasonal allergies 10/24/2015     Prior to Admission medications   Medication Sig Start Date End Date Taking? Authorizing Provider  beclomethasone (QVAR) 40 MCG/ACT inhaler Inhale 2 puffs into the lungs 2 (two) times daily. 12/11/14  Yes Collie SiadStephanie D English, PA  cetirizine (ZYRTEC) 10 MG tablet Take 1 tablet (10 mg total) by mouth daily. 10/11/14  Yes Stephanie D English, PA  ipratropium (ATROVENT) 0.03 % nasal spray Place 2 sprays into both nostrils 2 (two) times daily. 10/11/14  Yes Stephanie D English, PA  Naphazoline-Pheniramine (OPCON-A OP) Apply to eye daily.   Yes Historical Provider, MD  albuterol (PROVENTIL HFA;VENTOLIN HFA) 108 (90 BASE) MCG/ACT inhaler Inhale 2 puffs into the lungs every 4 (four) hours as  needed for wheezing. 10/11/14 10/11/15  Collie SiadStephanie D English, PA  montelukast (SINGULAIR) 10 MG tablet Take 1 tablet (10 mg total) by mouth at bedtime. Patient not taking: Reported on 10/11/2014 10/28/12 10/28/13  Ethelda ChickKristi M Smith, MD     No Known Allergies     Objective:  Physical Exam  Constitutional: He is oriented to person, place, and time. He appears well-developed and well-nourished. He is active and cooperative. No distress.  BP 119/79 mmHg  Pulse 64  Temp(Src) 97.6 F (36.4 C) (Oral)  Resp 18  Ht 5\' 11"  (1.803 m)  Wt 206 lb 8 oz (93.668 kg)  BMI 28.81 kg/m2  SpO2 98%  HENT:  Head: Normocephalic and atraumatic.  Right Ear: Hearing, tympanic membrane, external ear and ear canal normal.  Left Ear: Hearing, tympanic membrane, external ear and ear canal normal.  Nose: Rhinorrhea (clear) present. No mucosal edema.  Mouth/Throat: Uvula is midline, oropharynx is clear and moist and mucous membranes are normal. No oral lesions. Normal dentition. No uvula swelling. No oropharyngeal exudate.  Turbinates are pale  Eyes: Conjunctivae are normal. No scleral icterus.  Neck: Normal range of motion. Neck supple. No thyromegaly present.  Cardiovascular: Normal rate, regular rhythm and normal heart sounds.   Pulses:      Radial pulses are 2+ on the right side, and 2+ on the left side.  Pulmonary/Chest: Effort normal and breath sounds normal.  Lymphadenopathy:       Head (right side): No tonsillar, no preauricular, no posterior auricular and no occipital  adenopathy present.       Head (left side): No tonsillar, no preauricular, no posterior auricular and no occipital adenopathy present.    He has no cervical adenopathy.       Right: No supraclavicular adenopathy present.       Left: No supraclavicular adenopathy present.  Neurological: He is alert and oriented to person, place, and time. No sensory deficit.  Skin: Skin is warm, dry and intact. No rash noted. No cyanosis or erythema. Nails show  no clubbing.  Psychiatric: His speech is normal and behavior is normal. His mood appears not anxious. His affect is angry (frustrated? Not interested in discussing this with me). His affect is not blunt, not labile and not inappropriate. He does not exhibit a depressed mood.           Assessment & Plan:   1. Seasonal allergies 2. Asthma, moderate persistent, uncomplicated Encouraged him to resume the montelukast to see if his symptoms are improved. Happy to refer him if he would like to consider immunotherapy. - montelukast (SINGULAIR) 10 MG tablet; Take 1 tablet (10 mg total) by mouth at bedtime.  Dispense: 90 tablet; Refill: 3 - cetirizine (ZYRTEC) 10 MG tablet; Take 1 tablet (10 mg total) by mouth daily.  Dispense: 90 tablet; Refill: 3 - fluticasone (FLONASE) 50 MCG/ACT nasal spray; Place 2 sprays into both nostrils daily.  Dispense: 48 g; Refill: 3 - beclomethasone (QVAR) 40 MCG/ACT inhaler; Inhale 2 puffs into the lungs 2 (two) times daily.  Dispense: 3 Inhaler; Refill: 3 - albuterol (PROVENTIL HFA;VENTOLIN HFA) 108 (90 Base) MCG/ACT inhaler; Inhale 2 puffs into the lungs every 4 (four) hours as needed for wheezing.  Dispense: 1 Inhaler; Refill: 5   Fernande Bras, PA-C Physician Assistant-Certified [facility]

## 2015-10-24 NOTE — Patient Instructions (Signed)
     IF you received an x-ray today, you will receive an invoice from [facility]. Please contact [facility] at [phone] with questions or concerns regarding your invoice.   IF you received labwork today, you will receive an invoice from [facility]. Please contact [facility] at [phone] with questions or concerns regarding your invoice.   Our billing staff will not be able to assist you with questions regarding bills from these companies.  You will be contacted with the lab results as soon as they are available. The fastest way to get your results is to activate your My Chart account. Instructions are located on the last page of this paperwork. If you have not heard from us regarding the results in 2 weeks, please contact this office.      

## 2016-07-31 ENCOUNTER — Ambulatory Visit: Payer: BLUE CROSS/BLUE SHIELD | Admitting: Sports Medicine

## 2016-08-07 ENCOUNTER — Encounter: Payer: Self-pay | Admitting: Sports Medicine

## 2016-08-07 ENCOUNTER — Ambulatory Visit
Admission: RE | Admit: 2016-08-07 | Discharge: 2016-08-07 | Disposition: A | Discharge status: Home / Self Care | Payer: BLUE CROSS/BLUE SHIELD | Source: Ambulatory Visit | Attending: Sports Medicine | Admitting: Sports Medicine

## 2016-08-07 ENCOUNTER — Ambulatory Visit (INDEPENDENT_AMBULATORY_CARE_PROVIDER_SITE_OTHER): Payer: BLUE CROSS/BLUE SHIELD | Admitting: Sports Medicine

## 2016-08-07 VITALS — BP 136/85 | HR 80 | Ht 71.0 in | Wt 200.0 lb

## 2016-08-07 DIAGNOSIS — G8929 Other chronic pain: Secondary | ICD-10-CM

## 2016-08-07 DIAGNOSIS — M5441 Lumbago with sciatica, right side: Secondary | ICD-10-CM | POA: Diagnosis not present

## 2016-08-07 DIAGNOSIS — M545 Low back pain, unspecified: Secondary | ICD-10-CM

## 2016-08-07 NOTE — Progress Notes (Signed)
   Subjective:    Patient ID: Brett Schneider, male    DOB: [birth year], 46 y.o.   MRN: [id number]  HPI chief complaint: Low back pain  Very pleasant 46 year old male comes in today complaining of almost 7 years of right-sided low back pain. He injured himself acutely in 2011 while doing squats in the gym. He felt an immediate onset of severe pain in the right side of his lower back. Since that time he has had persistent "discomfort". His discomfort did initially improve but never completely resolved. Over the years it has gradually gotten worse. He describes a "tightness" that begins in the right side of his low back and will radiate into his right leg. He notices weakness from time to time as well. He denies numbness or tingling. His symptoms are most noticeable when bending forward especially when putting his weight on his right leg. He also notices pain with driving. No prior low back surgeries. He has not had any imaging. He does not take any medication for his discomfort. He is referred to us today by Dr.Pharr.  Past medical history reviewed Medications reviewed Allergies reviewed     Review of Systems As above     Objective:   Physical Exam  Well-developed, well-nourished. No acute distress. Awake alert and oriented 3. Vital signs reviewed.  Lumbar spine: Full lumbar range of motion. He has discomfort with forward flexion. Painless extension. No tenderness to palpation along the midline. No spasm. No tenderness over the SI joint.  Neurological exam: 4+/5 strength with resisted great toe extension on the right compared to 5/5 on the left. Remainder of his strength is 5/5 bilaterally. No atrophy. Reflexes are brisk and equal at the Achilles and patellar tendons. Sensation is intact to light touch grossly. Equivocal straight leg raise.   Right hip: Smooth painless hip range of motion with a negative logroll. No tenderness to palpation.  X-rays of his lumbar spine including AP and lateral  views show minimal degenerative changes. Nothing acute      Assessment & Plan:   Chronic right-sided low back pain with sciatica  Patient's x-rays are fairly unremarkable. His symptoms all began with a traumatic event while lifting weights. Given the chronicity of his symptoms and his weakness on physical exam I think we should pursue an MRI scan of his lumbar spine specifically to rule out a right-sided lumbar disc herniation. Patient will follow-up with me in the office 1-2 days after the MRI to discuss the results and to dekiniate further treatment.

## 2016-08-11 ENCOUNTER — Ambulatory Visit
Admission: RE | Admit: 2016-08-11 | Discharge: 2016-08-11 | Disposition: A | Discharge status: Home / Self Care | Payer: BLUE CROSS/BLUE SHIELD | Source: Ambulatory Visit | Attending: Sports Medicine | Admitting: Sports Medicine

## 2016-08-11 DIAGNOSIS — G8929 Other chronic pain: Secondary | ICD-10-CM

## 2016-08-11 DIAGNOSIS — M5441 Lumbago with sciatica, right side: Principal | ICD-10-CM

## 2016-08-13 ENCOUNTER — Telehealth: Payer: Self-pay | Admitting: Sports Medicine

## 2016-08-13 NOTE — Telephone Encounter (Signed)
I spoke with the patient on the phone today after reviewing the MRI of his lumbar spine. 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 He also has a left foraminal protrusion at L4-L5 but he is not complaining of any left-sided radiculopathy. He has not had any treatment for this. I recommended that we try physical therapy before referring him to neurosurgery. I would like for him to work with Brett SiaJohn Schneider and follow-up with me in 4 weeks for reevaluation. If he continues to have pain or weakness despite starting physical therapy then we may need to reconsider merits of neurosurgical referral.

## 2016-10-18 ENCOUNTER — Other Ambulatory Visit: Payer: Self-pay

## 2016-10-18 MED ORDER — BECLOMETHASONE DIPROP HFA 40 MCG/ACT IN AERB: 2.0000 | INHALATION_SPRAY | Freq: Two times a day (BID) | RESPIRATORY_TRACT | 1 refills | Status: AC

## 2016-10-18 NOTE — Progress Notes (Unsigned)
qvar

## 2016-11-06 ENCOUNTER — Other Ambulatory Visit: Payer: Self-pay

## 2016-11-06 MED ORDER — BECLOMETHASONE DIPROPIONATE 40 MCG/ACT IN AERS: 2.0000 | INHALATION_SPRAY | Freq: Two times a day (BID) | RESPIRATORY_TRACT | 0 refills | Status: AC

## 2016-12-30 ENCOUNTER — Other Ambulatory Visit: Payer: Self-pay | Admitting: Physician Assistant

## 2016-12-30 DIAGNOSIS — J4540 Moderate persistent asthma, uncomplicated: Secondary | ICD-10-CM

## 2016-12-30 DIAGNOSIS — J302 Other seasonal allergic rhinitis: Secondary | ICD-10-CM

## 2017-01-11 ENCOUNTER — Other Ambulatory Visit: Payer: Self-pay | Admitting: Physician Assistant

## 2017-01-11 DIAGNOSIS — J4540 Moderate persistent asthma, uncomplicated: Secondary | ICD-10-CM

## 2017-02-28 ENCOUNTER — Other Ambulatory Visit: Payer: Self-pay | Admitting: Physician Assistant

## 2017-02-28 DIAGNOSIS — J302 Other seasonal allergic rhinitis: Secondary | ICD-10-CM

## 2017-02-28 DIAGNOSIS — J4540 Moderate persistent asthma, uncomplicated: Secondary | ICD-10-CM

## 2017-10-03 ENCOUNTER — Other Ambulatory Visit: Payer: Self-pay | Admitting: Physician Assistant

## 2017-10-03 DIAGNOSIS — J302 Other seasonal allergic rhinitis: Secondary | ICD-10-CM

## 2017-10-12 ENCOUNTER — Other Ambulatory Visit: Payer: Self-pay | Admitting: Physician Assistant

## 2017-10-12 DIAGNOSIS — J302 Other seasonal allergic rhinitis: Secondary | ICD-10-CM

## 2017-10-24 ENCOUNTER — Other Ambulatory Visit: Payer: Self-pay | Admitting: Physician Assistant

## 2017-10-24 DIAGNOSIS — J302 Other seasonal allergic rhinitis: Secondary | ICD-10-CM

## 2017-10-26 NOTE — Telephone Encounter (Signed)
Zyrtec 10 mg tablet refill request  LOV 10/24/15 with [facility]le Jeffery  [facility] [address] - , [place] [id number]

## 2017-10-27 ENCOUNTER — Other Ambulatory Visit: Payer: Self-pay | Admitting: Physician Assistant

## 2017-10-27 DIAGNOSIS — J302 Other seasonal allergic rhinitis: Secondary | ICD-10-CM

## 2017-10-27 NOTE — Telephone Encounter (Signed)
Left VM pt needs appt or can contact current PCP.

## 2018-03-20 ENCOUNTER — Ambulatory Visit (HOSPITAL_COMMUNITY)
Admission: EM | Admit: 2018-03-20 | Discharge: 2018-03-20 | Disposition: A | Discharge status: Home / Self Care | Payer: BLUE CROSS/BLUE SHIELD | Attending: Family Medicine | Admitting: Family Medicine

## 2018-03-20 ENCOUNTER — Encounter (HOSPITAL_COMMUNITY): Payer: Self-pay | Admitting: *Deleted

## 2018-03-20 DIAGNOSIS — Z9109 Other allergy status, other than to drugs and biological substances: Secondary | ICD-10-CM

## 2018-03-20 DIAGNOSIS — J0110 Acute frontal sinusitis, unspecified: Secondary | ICD-10-CM

## 2018-03-20 MED ORDER — BECLOMETHASONE DIPROP MONOHYD 42 MCG/SPRAY NA SUSP: 2.0000 | Freq: Two times a day (BID) | NASAL | 12 refills | Status: AC

## 2018-03-20 MED ORDER — AMOXICILLIN-POT CLAVULANATE 875-125 MG PO TABS: 1.0000 | ORAL_TABLET | Freq: Two times a day (BID) | ORAL | 0 refills | Status: AC

## 2018-03-20 NOTE — Discharge Instructions (Addendum)
Drink plenty of fluids Use beclomethasone nasal spray twice a day Use the saline nasal wash for beclomethasone Take antibiotic twice a day for 1 week Tylenol or ibuprofen for pain or fever See your primary care doctor if not improving by next week

## 2018-03-20 NOTE — ED Triage Notes (Signed)
Pt being seen by Dr. Delton See at this time.

## 2018-03-20 NOTE — ED Provider Notes (Signed)
[facility]    CSN: [id number] Arrival date & time: 03/20/18  1625     History   Chief Complaint Chief Complaint  Patient presents with  . URI    HPI Brett Schneider is a 47 y.o. male.   HPI  Patient has underlying allergies.  He has recurring sinus problems.  He has wheezing at times.  He has currently an infection that is been going on for 8 to 9 days.  Sinus pressure and pain.  Bloody sinus discharge.  He cannot taste.  He does not smell.  His appetite is poor.  He has a cough.  He has a headache.  He has ear pressure and pain.  He was trying to treat this with Mucinex, Tylenol Sinus, and over-the-counter medications.  He has not any better.  He has not been using his Flonase.  He occasionally takes Zyrtec.  He is not currently taking his Singulair.  Past Medical History:  Diagnosis Date  . Allergy   . Asthma     Patient Active Problem List   Diagnosis Date Noted  . Seasonal allergies 10/24/2015    Past Surgical History:  Procedure Laterality Date  . CYST REMOVAL HAND Right 2009   granulomatous       Home Medications    Prior to Admission medications   Medication Sig Start Date End Date Taking? Authorizing Provider  albuterol (PROVENTIL HFA;VENTOLIN HFA) 108 (90 Base) MCG/ACT inhaler Inhale 2 puffs into the lungs every 4 (four) hours as needed for wheezing. 10/24/15 10/23/16  Porfirio Oar, PA  amoxicillin-clavulanate (AUGMENTIN) 875-125 MG tablet Take 1 tablet by mouth every 12 (twelve) hours. 03/20/18   Eustace Moore, MD  beclomethasone (BECONASE-AQ) 42 MCG/SPRAY nasal spray Place 2 sprays into both nostrils 2 (two) times daily. Use for 1-2 weeks 03/20/18   Eustace Moore, MD  beclomethasone (QVAR) 40 MCG/ACT inhaler Inhale 2 puffs into the lungs 2 (two) times daily. 11/06/16   Porfirio Oar, PA  Beclomethasone Diprop HFA (QVAR REDIHALER) 40 MCG/ACT AERB Inhale 2 puffs into the lungs 2 (two) times daily. 10/18/16   Porfirio Oar, PA  cetirizine  (ZYRTEC) 10 MG tablet TAKE 1 TABLET BY MOUTH EVERY DAY 03/02/17   Porfirio Oar, PA  fluticasone (FLONASE) 50 MCG/ACT nasal spray Place 2 sprays into both nostrils daily. 10/24/15   Jeffery, Avelino Leeds, PA  montelukast (SINGULAIR) 10 MG tablet TAKE 1 TABLET BY MOUTH AT BEDTIME. OFFICE VISIT NEEDED FOR REFILLS 03/02/17   Porfirio Oar, PA  Naphazoline-Pheniramine (OPCON-A OP) Apply to eye daily.    [provider]    Family History No family history on file.  Social History Social History   Tobacco Use  . Smoking status: Never Smoker  . Smokeless tobacco: Never Used  Substance Use Topics  . Alcohol use: Yes    Alcohol/week: 0.0 standard drinks  . Drug use: No     Allergies   Patient has no known allergies.   Review of Systems Review of Systems  Constitutional: Positive for chills, fatigue and fever.  HENT: Positive for congestion, nosebleeds, postnasal drip, rhinorrhea, sinus pressure, sinus pain and sore throat. Negative for ear pain.   Eyes: Negative for pain and visual disturbance.  Respiratory: Positive for cough. Negative for shortness of breath and wheezing.   Cardiovascular: Negative for chest pain and palpitations.  Gastrointestinal: Negative for abdominal pain and vomiting.  Genitourinary: Negative for dysuria and hematuria.  Musculoskeletal: Negative for arthralgias and back pain.  Skin: Negative  for color change and rash.  Neurological: Negative for seizures and syncope.  All other systems reviewed and are negative.    Physical Exam Triage Vital Signs ED Triage Vitals  Enc Vitals Group     BP 03/20/18 1718 133/90     Pulse Rate 03/20/18 1718 68     Resp 03/20/18 1718 16     Temp 03/20/18 1718 98.6 F (37 C)     Temp Source 03/20/18 1718 Oral     SpO2 03/20/18 1718 100 %   No data found.  Updated Vital Signs BP 133/90 (BP Location: Left Arm)   Pulse 68   Temp 98.6 F (37 C) (Oral)   Resp 16   SpO2 100%       Physical Exam    Constitutional: He appears well-developed and well-nourished. No distress.  HENT:  Head: Normocephalic and atraumatic.  Right Ear: External ear normal.  Left Ear: External ear normal.  Right swollen red.  Posterior pharynx is red.  Sinus tenderness of the frontal and ethmoid regions.  Positive cervical adenopathy.  Eyes: Pupils are equal, round, and reactive to light. Conjunctivae are normal.  Neck: Normal range of motion.  Cardiovascular: Normal rate, regular rhythm and normal heart sounds.  Pulmonary/Chest: Effort normal. No respiratory distress. He has wheezes.  Scattered inspiratory wheeze  Abdominal: Soft. He exhibits no distension.  Musculoskeletal: Normal range of motion. He exhibits no edema.  Lymphadenopathy:    He has cervical adenopathy.  Neurological: He is alert.  Skin: Skin is warm and dry.  Psychiatric: He has a normal mood and affect. His behavior is normal.     UC Treatments / Results  Labs (all labs ordered are listed, but only abnormal results are displayed) Labs Reviewed - No data to display  EKG None  Radiology No results found.  Procedures Procedures (including critical care time)  Medications Ordered in UC Medications - No data to display  Initial Impression / Assessment and Plan / UC Course  I have reviewed the triage vital signs and the nursing notes.  Pertinent labs & imaging results that were available during my care of the patient were reviewed by me and considered in my medical decision making (see chart for details).     Discussed that both of these infections are caused by viruses.  With his loss of smell, bloody discharge, and sinus pressure I am going to treat him with an antibiotic. Final Clinical Impressions(s) / UC Diagnoses   Final diagnoses:  Acute frontal sinusitis, recurrence not specified  History of environmental allergies     Discharge Instructions     Drink plenty of fluids Use beclomethasone nasal spray twice a  day Use the saline nasal wash for beclomethasone Take antibiotic twice a day for 1 week Tylenol or ibuprofen for pain or fever See your primary care doctor if not improving by next week   ED Prescriptions    Medication Sig Dispense Auth. Provider   amoxicillin-clavulanate (AUGMENTIN) 875-125 MG tablet Take 1 tablet by mouth every 12 (twelve) hours. 14 tablet Eustace Moore, MD   beclomethasone (BECONASE-AQ) 42 MCG/SPRAY nasal spray Place 2 sprays into both nostrils 2 (two) times daily. Use for 1-2 weeks 25 g Eustace Moore, MD     Controlled Substance Prescriptions [place] Controlled Substance Registry consulted? Not Applicable   Eustace Moore, MD 03/20/18 [phone]

## 2018-03-21 ENCOUNTER — Other Ambulatory Visit: Payer: Self-pay | Admitting: Family Medicine

## 2020-08-20 ENCOUNTER — Other Ambulatory Visit: Payer: Self-pay | Admitting: Internal Medicine

## 2020-08-20 DIAGNOSIS — Z0001 Encounter for general adult medical examination with abnormal findings: Secondary | ICD-10-CM

## 2020-10-01 ENCOUNTER — Other Ambulatory Visit: Payer: Self-pay | Admitting: Internal Medicine

## 2020-10-01 ENCOUNTER — Ambulatory Visit
Admission: RE | Admit: 2020-10-01 | Discharge: 2020-10-01 | Disposition: A | Discharge status: Home / Self Care | Payer: BC Managed Care – PPO | Source: Ambulatory Visit | Attending: Internal Medicine | Admitting: Internal Medicine

## 2020-10-01 ENCOUNTER — Ambulatory Visit
Admission: RE | Admit: 2020-10-01 | Discharge: 2020-10-01 | Disposition: A | Discharge status: Home / Self Care | Payer: No Typology Code available for payment source | Source: Ambulatory Visit | Attending: Internal Medicine | Admitting: Internal Medicine

## 2020-10-01 DIAGNOSIS — Z0001 Encounter for general adult medical examination with abnormal findings: Secondary | ICD-10-CM

## 2021-07-23 ENCOUNTER — Other Ambulatory Visit: Payer: Self-pay

## 2021-07-23 ENCOUNTER — Other Ambulatory Visit: Payer: Self-pay | Admitting: Family Medicine

## 2021-07-23 ENCOUNTER — Ambulatory Visit: Payer: Self-pay

## 2021-07-23 DIAGNOSIS — M79644 Pain in right finger(s): Secondary | ICD-10-CM

## 2022-12-26 IMAGING — CT CT CARDIAC CORONARY ARTERY CALCIUM SCORE
3 series · 14 of 20 positions shown, 16 images · non-contrast
Comparison: None.

CLINICAL DATA: 49-year-old Indian male history of hyperlipidemia.

EXAM:
CT CARDIAC CORONARY ARTERY CALCIUM SCORE
TECHNIQUE: Non-contrast imaging through the heart was performed using
prospective ECG gating. Image post processing was performed on an
independent workstation, allowing for quantitative analysis of the
heart and coronary arteries. Note that this exam targets the heart
and the chest was not imaged in its entirety.

[Series 2: calcium scoring 2.00 qr36 bestdiast 73% hrt calciu · axial · 0.38mm/px · z∈[+1722,+1794]mm · 4 of 60 slices shown]
[im 12/60  vessel]
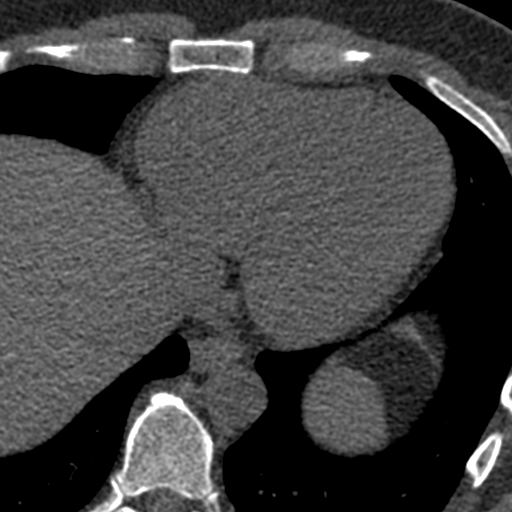
[im 24/60  vessel]
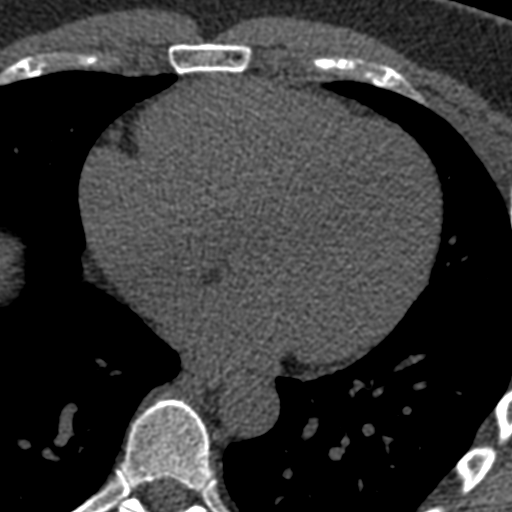
[im 36/60  vessel]
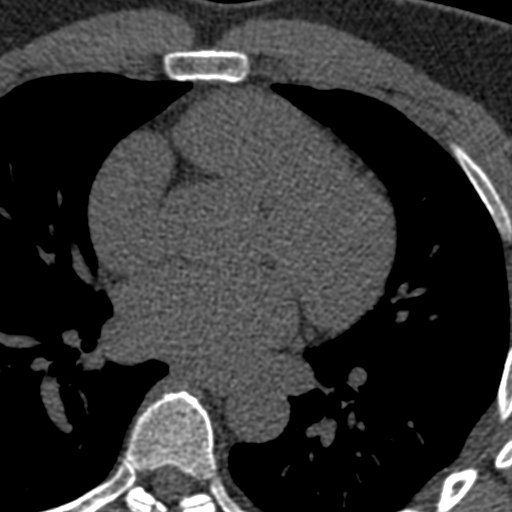
[im 48/60  vessel]
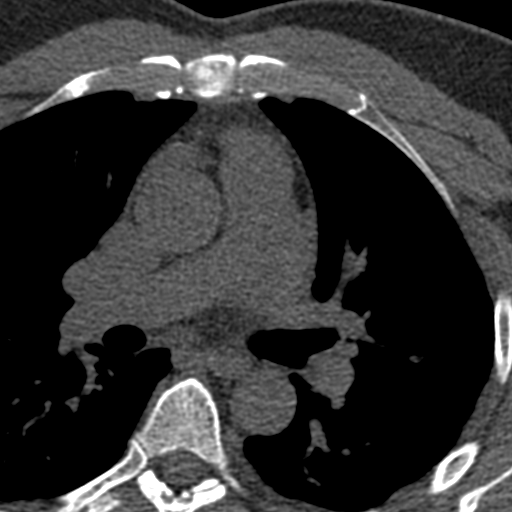

[Series 3: calcium scoring 2.00 br40 bestdiast 73% axial · axial · 0.63mm/px · z∈[+1718,+1798]mm · 5 of 60 slices shown, 7 images]
[im 10/60  vessel]
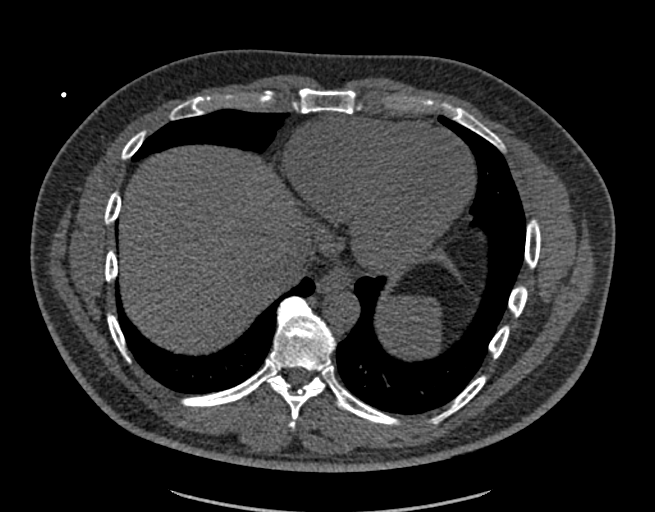
[im 10/60  lung]
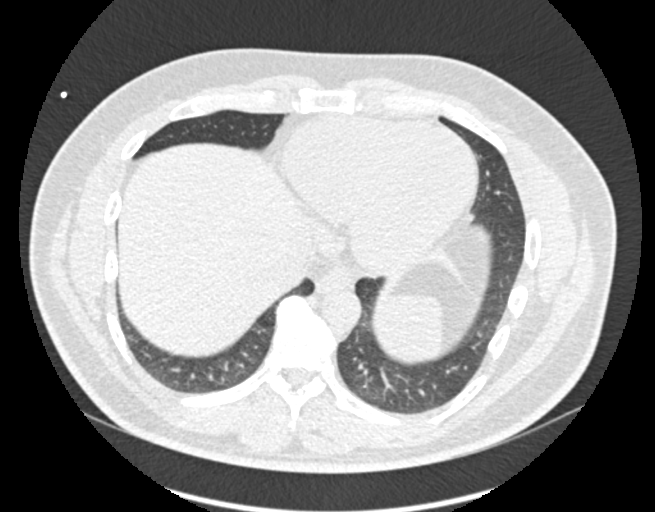
[im 20/60  vessel]
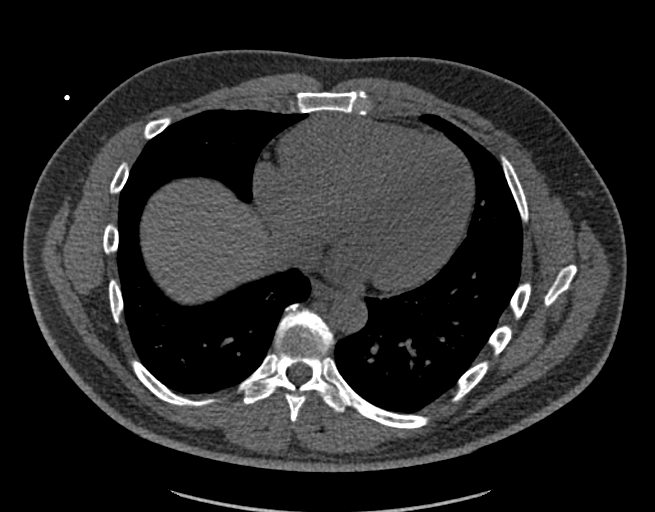
[im 30/60  vessel]
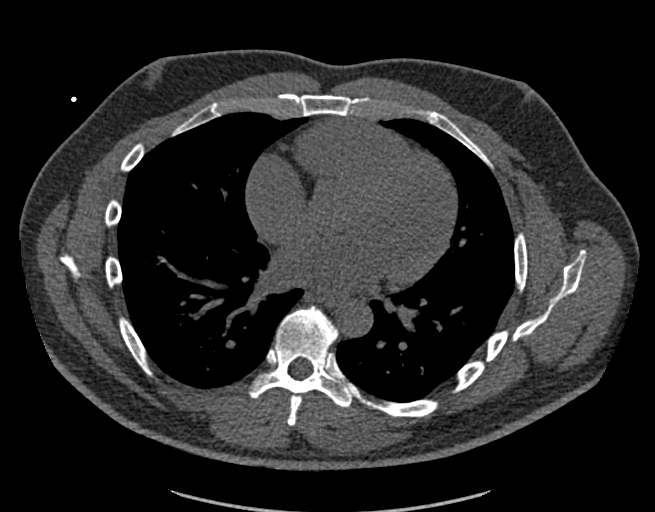
[im 40/60  vessel]
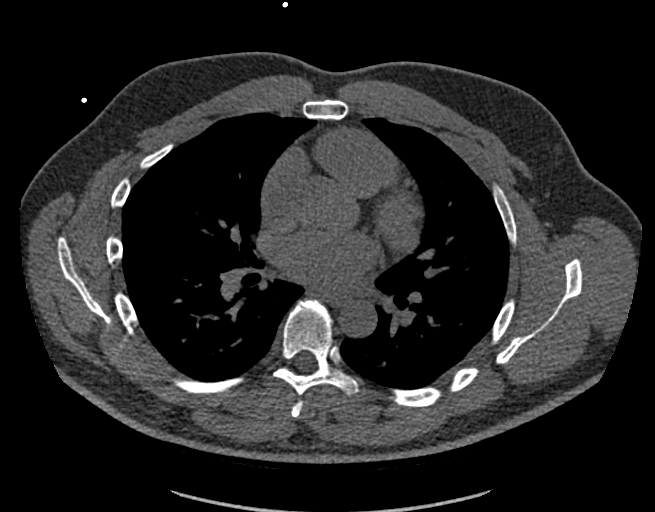
[im 50/60  vessel]
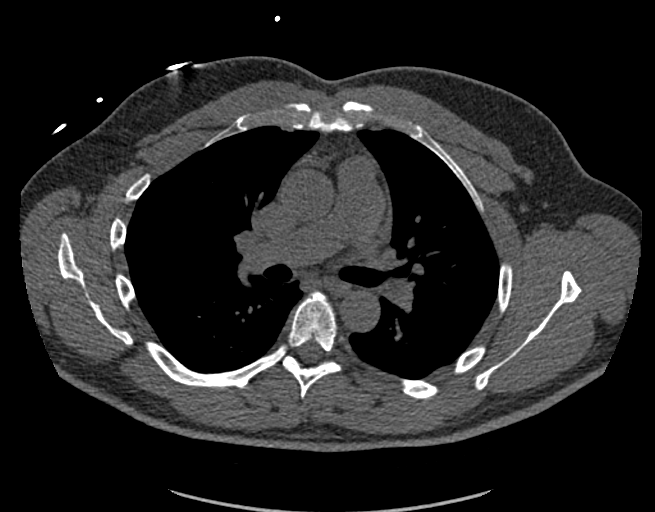
[im 50/60  lung]
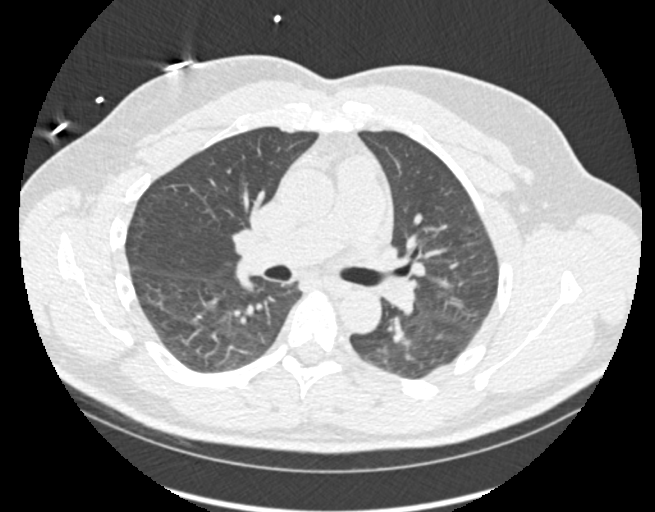

[Series 9: calcium scoring 2.00 br60 bestdiast 73% lungs · axial · 0.63mm/px · z∈[+1718,+1798]mm · 5 of 60 slices shown]
[im 10/60  vessel]
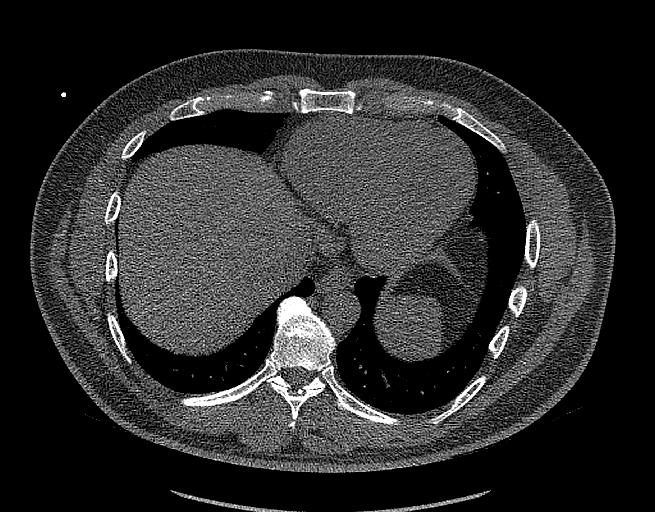
[im 20/60  vessel]
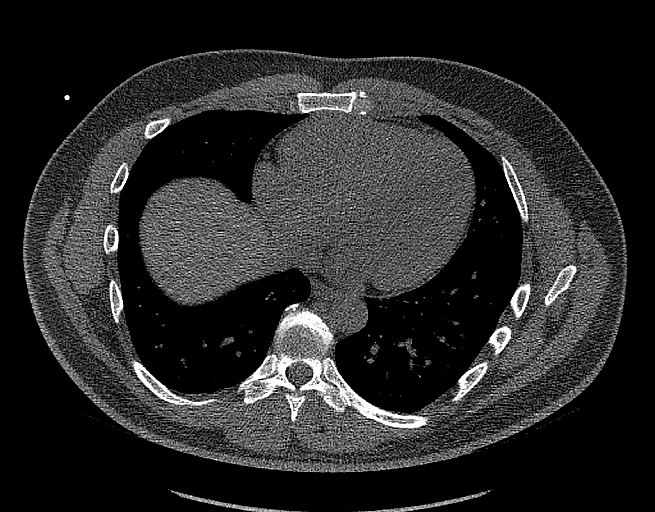
[im 30/60  vessel]
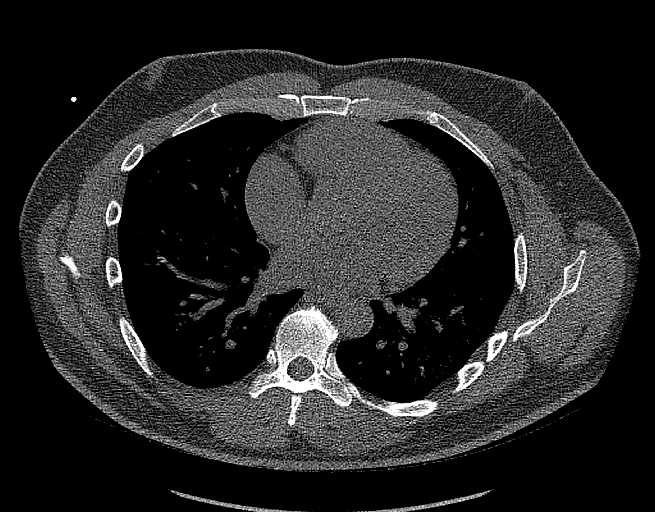
[im 40/60  vessel]
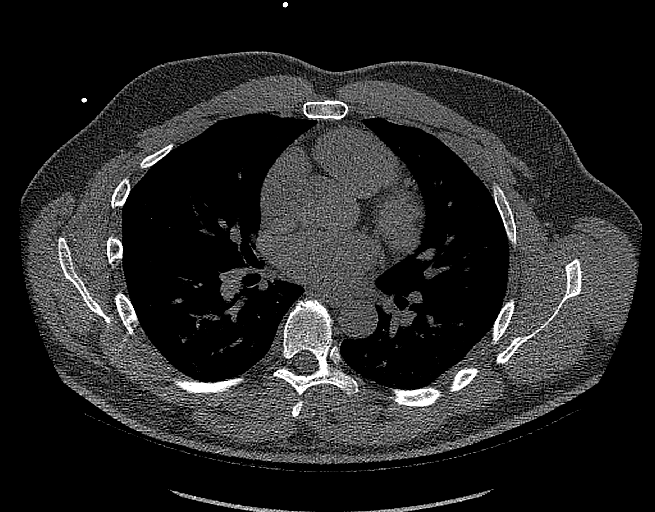
[im 50/60  vessel]
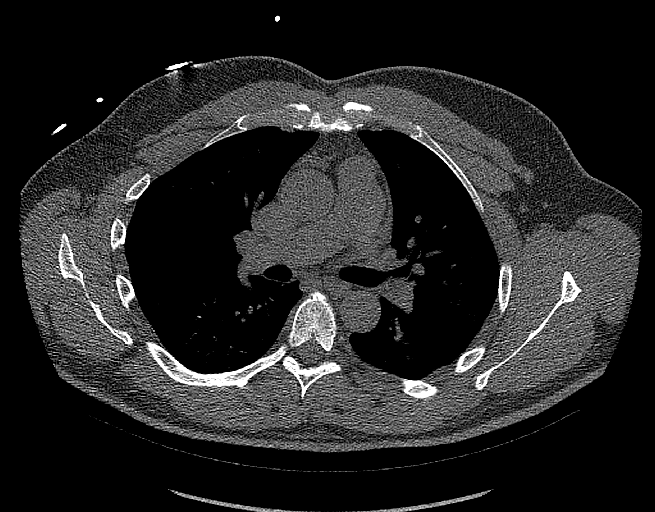

[14 of 20 positions shown; findings below may reference images not displayed]

FINDINGS: CORONARY CALCIUM SCORES:

Left Main: 0

LAD: 0

LCx: 0

RCA: 0

Total Agatston Score: 0

[HOSPITAL] percentile: 0

AORTA MEASUREMENTS:

Ascending Aorta: 33 mm

Descending Aorta: 24 mm

OTHER FINDINGS:

The heart size is within normal limits. No pericardial fluid
identified. Visualized segments of the thoracic aorta and central
pulmonary arteries are normal in caliber. Visualized mediastinum and
hilar regions demonstrate no lymphadenopathy or masses. Most
superior image demonstrates partially calcified precarinal/lower
right paratracheal lymph nodes consistent with prior granulomatous
disease. Visualized lungs show no evidence of pulmonary edema,
consolidation, pneumothorax, nodule or pleural fluid. Visualized
upper abdomen and bony structures are unremarkable.
IMPRESSION: 1. Coronary calcium score of 0.
2. Evidence of prior granulomatous disease with partially calcified
precarinal/lower right paratracheal lymph nodes.

## 2023-10-17 IMAGING — DX DG FINGER THUMB 2+V*R*
3 series · 3 of 3 positions shown · non-contrast
Comparison: None.

CLINICAL DATA: Hyperextension injury of the right thumb 06/27/2021

EXAM:
RIGHT THUMB 2+V

[finger ap]
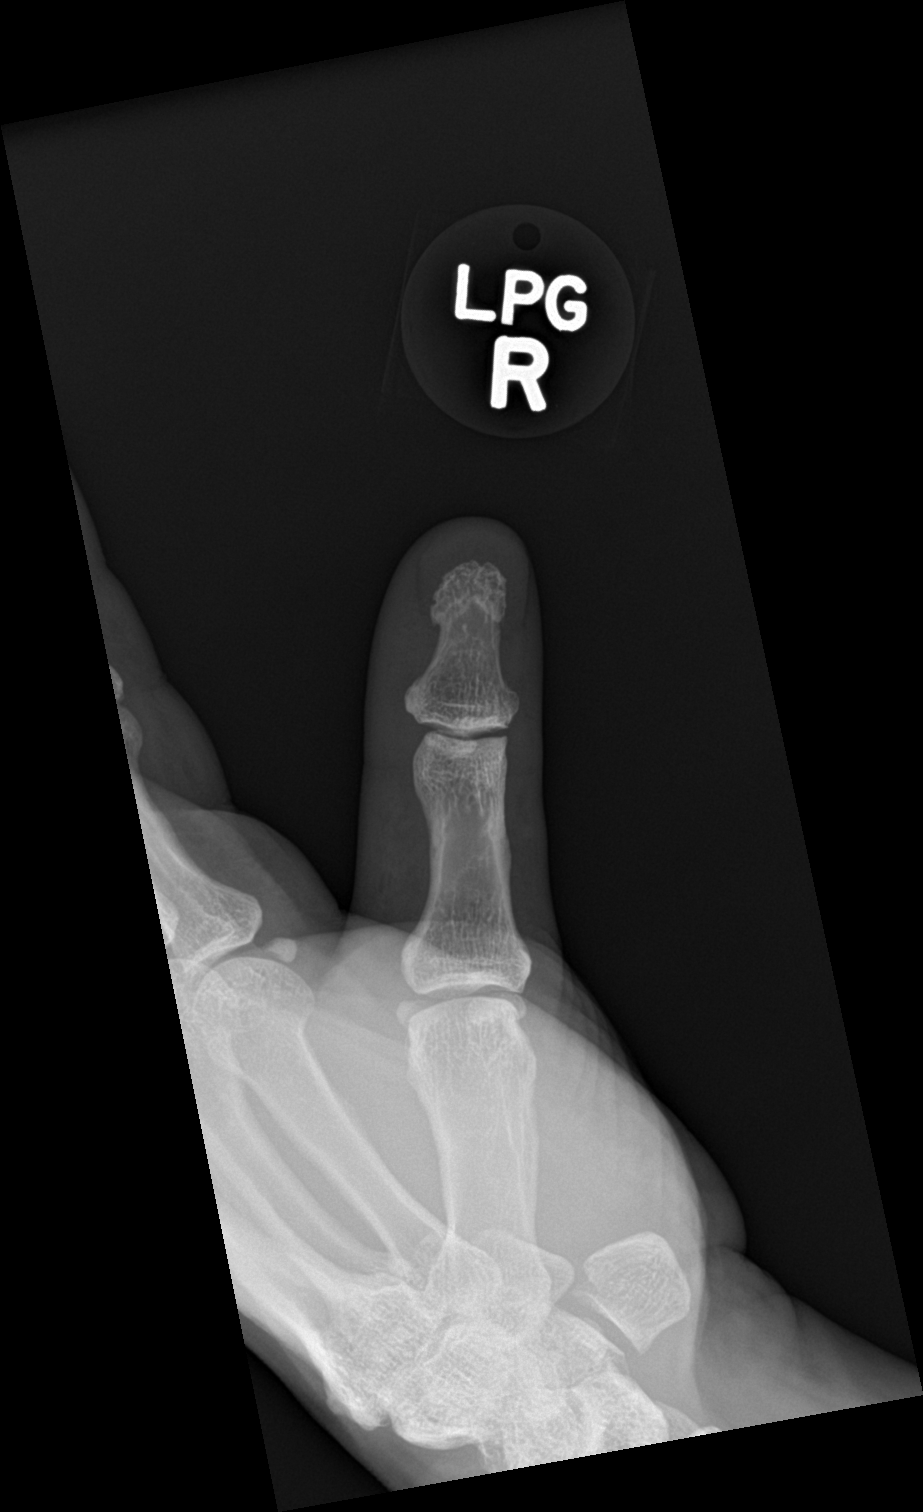

[finger obl]
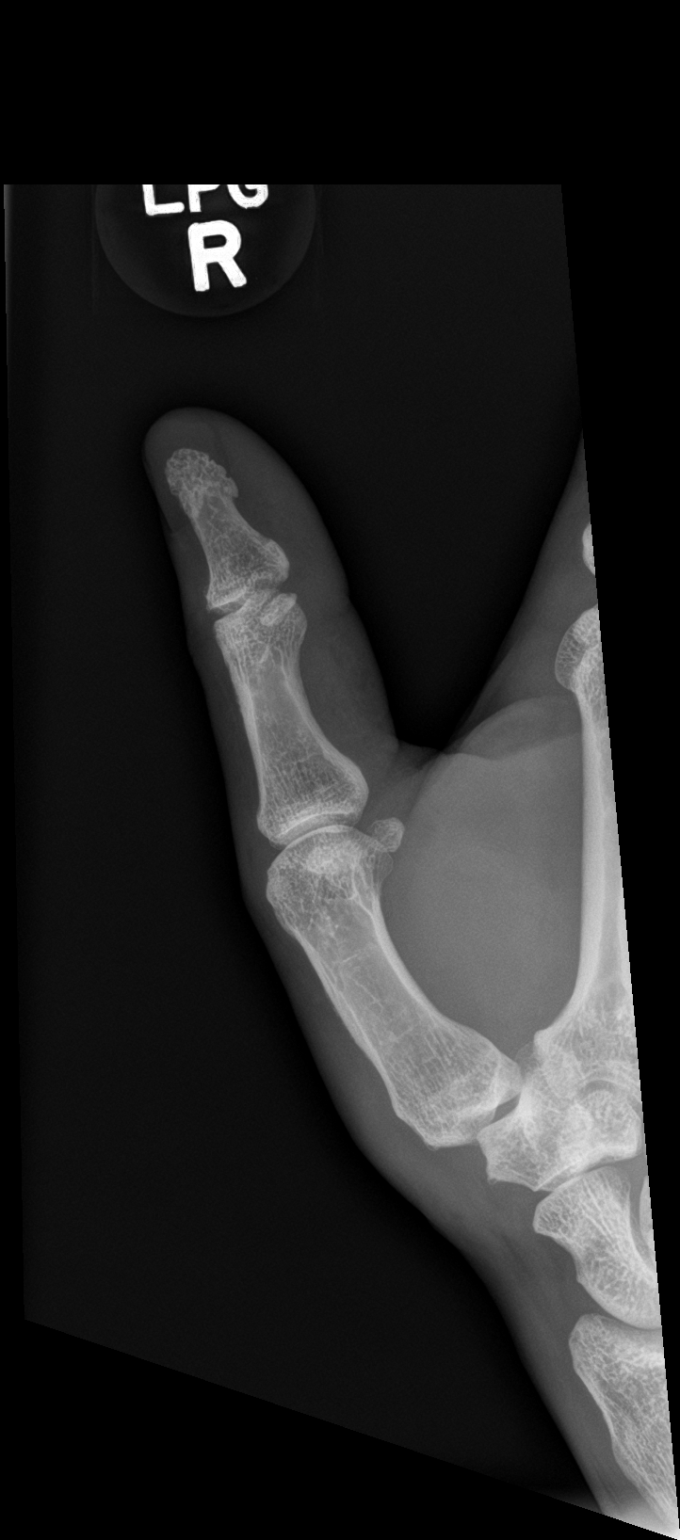

[finger lat]
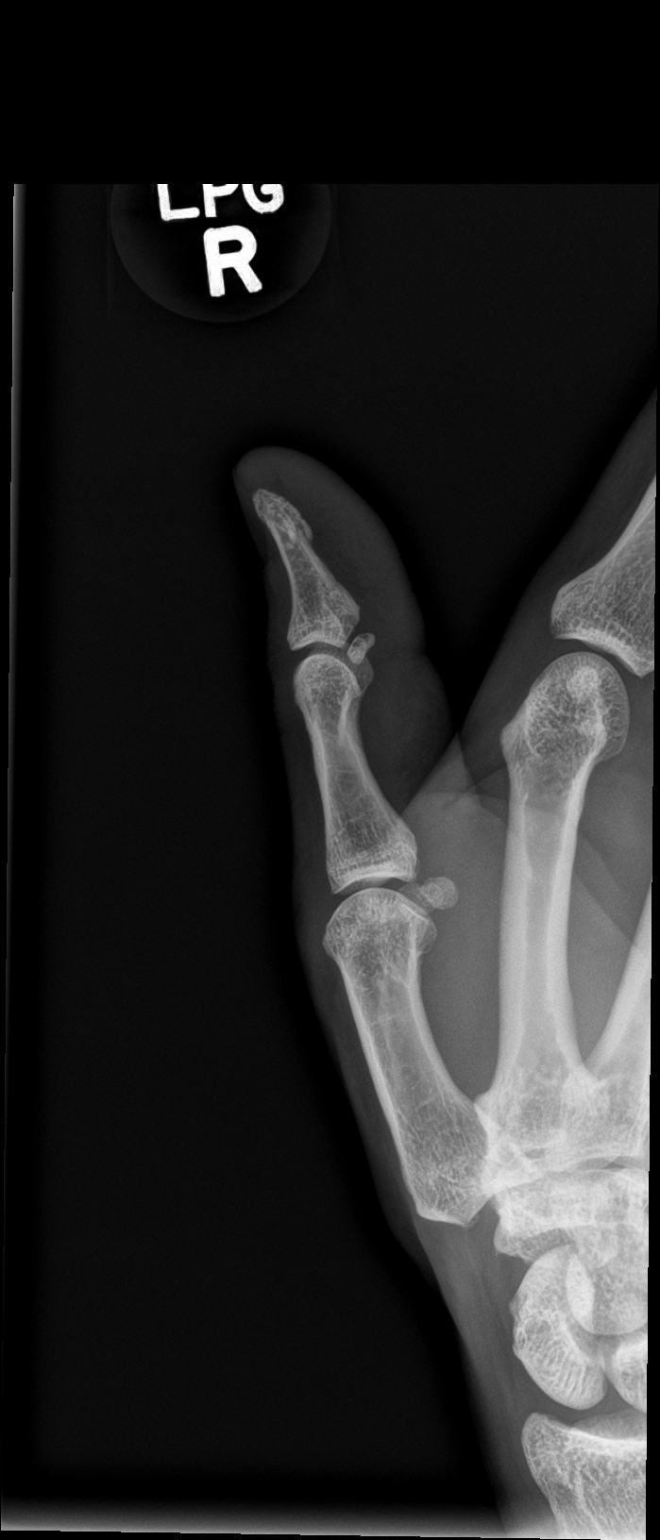

[3 of 3 positions shown; findings below may reference images not displayed]

FINDINGS: No acute fracture or dislocation. No aggressive osseous lesion.
Normal alignment. Mild early osteoarthritis of the first CMC joint.

Soft tissue are unremarkable. No radiopaque foreign body or soft
tissue emphysema.
IMPRESSION: No acute osseous injury of the right thumb.
